# Patient Record
Sex: Female | Born: 1975 | Race: Black or African American | Hispanic: No | Marital: Single | Smoking: Never smoker
Health system: Southern US, Community
[De-identification: ages and names within clinical notes are randomized; demographics above are authoritative.]

---

## 2012-03-14 DIAGNOSIS — Z8781 Personal history of (healed) traumatic fracture: Secondary | ICD-10-CM | POA: Insufficient documentation

## 2012-04-19 DIAGNOSIS — M25473 Effusion, unspecified ankle: Secondary | ICD-10-CM | POA: Insufficient documentation

## 2012-04-19 DIAGNOSIS — M25673 Stiffness of unspecified ankle, not elsewhere classified: Secondary | ICD-10-CM | POA: Insufficient documentation

## 2012-05-31 DIAGNOSIS — M76829 Posterior tibial tendinitis, unspecified leg: Secondary | ICD-10-CM | POA: Insufficient documentation

## 2012-07-08 DIAGNOSIS — M7500 Adhesive capsulitis of unspecified shoulder: Secondary | ICD-10-CM | POA: Insufficient documentation

## 2013-03-26 ENCOUNTER — Emergency Department: Payer: Self-pay | Admitting: Emergency Medicine
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
## 2017-10-14 ENCOUNTER — Encounter: Payer: Self-pay | Admitting: Podiatry

## 2017-10-14 ENCOUNTER — Ambulatory Visit (INDEPENDENT_AMBULATORY_CARE_PROVIDER_SITE_OTHER): Payer: 59

## 2017-10-14 ENCOUNTER — Telehealth: Payer: Self-pay | Admitting: *Deleted

## 2017-10-14 ENCOUNTER — Ambulatory Visit (INDEPENDENT_AMBULATORY_CARE_PROVIDER_SITE_OTHER): Payer: 59 | Admitting: Podiatry

## 2017-10-14 VITALS — BP 112/76 | HR 75 | Resp 16

## 2017-10-14 DIAGNOSIS — M7751 Other enthesopathy of right foot: Secondary | ICD-10-CM

## 2017-10-14 DIAGNOSIS — M19079 Primary osteoarthritis, unspecified ankle and foot: Secondary | ICD-10-CM | POA: Diagnosis not present

## 2017-10-14 DIAGNOSIS — S82899A Other fracture of unspecified lower leg, initial encounter for closed fracture: Secondary | ICD-10-CM

## 2017-10-14 NOTE — Telephone Encounter (Signed)
Orders to J. Quintana, RN for pre-cert, and faxed to [facility]. 

## 2017-10-14 NOTE — Progress Notes (Signed)
  Subjective:  Patient ID: Kathleen Atkinson, female    DOB: 1976-08-20,  MRN: [id number] HPI Chief Complaint  Patient presents with  . Ankle Pain    Medial ankle right - injury and surgery in 2012, notice she is having more burning, tightness, can't rotate or bend ankle, over the years she has had PT, ice, injections, thinks she has developed scar tissue, affects walking(weak)    41 y.o. female presents with the above complaint.     No past medical history on file. No past surgical history on file. No current outpatient medications on file.  No Known Allergies Review of Systems  Musculoskeletal: Positive for gait problem.  All other systems reviewed and are negative.  Objective:   Vitals:   10/14/17 0903  BP: 112/76  Pulse: 75  Resp: 16    General: Well developed, nourished, in no acute distress, alert and oriented x3   Dermatological: Skin is warm, dry and supple bilateral. Nails x 10 are well maintained; remaining integument appears unremarkable at this time. There are no open sores, no preulcerative lesions, no rash or signs of infection present.  Vascular: Dorsalis Pedis artery and Posterior Tibial artery pedal pulses are 2/4 bilateral with immedate capillary fill time. Pedal hair growth present. No varicosities and no lower extremity edema present bilateral.   Neruologic: Grossly intact via light touch bilateral. Vibratory intact via tuning fork bilateral. Protective threshold with Semmes Wienstein monofilament intact to all pedal sites bilateral. Patellar and Achilles deep tendon reflexes 2+ bilateral. No Babinski or clonus noted bilateral.   Musculoskeletal: No gross boney pedal deformities bilateral. No pain, crepitus, or limitation noted with foot and ankle range of motion bilateral. Muscular strength 5/5 in all groups tested bilateral. She has pain on palpation to the medial malleolus and the skin is very sensitive area most likely allodynia or an allodynic type pain.  She has limited range of motion with dorsiflexion and plantarflexion with and without assistance. Flexed knee is no better dorsiflexion. She has some crepitation on range of motion. Screws and plate are very prominent.  Gait: Unassisted, Nonantalgic.    Radiographs:  3 views right ankle demonstrates plate and screws to the right ankle. Screws were removed from the medial malleolus. Joint space narrowing some subchondral sclerosis but no eburnation no cystic deformity.  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  Plan: At this point I highly recommended that she follow-up with Dr. Logan BoresEvans for possible ankle arthroscopy. We are requesting a CT scan secondary to the chronic pain and inability to move the ankle without pain.     Torrie Lafavor T. SunizonaHyatt, [place]
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
## 2017-10-26 ENCOUNTER — Telehealth: Payer: Self-pay | Admitting: *Deleted

## 2017-10-26 ENCOUNTER — Ambulatory Visit
Admission: RE | Admit: 2017-10-26 | Discharge: 2017-10-26 | Disposition: A | Discharge status: Home / Self Care | Payer: 59 | Source: Ambulatory Visit | Attending: Podiatry | Admitting: Podiatry

## 2017-10-26 NOTE — Telephone Encounter (Signed)
-----   Message from Elinor ParkinsonMax T Hyatt, [place] sent at 10/26/2017 11:56 AM EST ----- Have her in to see Dr. Logan BoresEvans.  He may feel that she needs an ankle arthroscopy.

## 2017-10-26 NOTE — Telephone Encounter (Signed)
Thanks

## 2017-10-26 NOTE — Telephone Encounter (Signed)
Left message informing pt Dr. Al CorpusHyatt had reviewed the CT results, and recommended pt see Dr. Logan BoresEvans to discuss treatment options.

## 2017-11-03 ENCOUNTER — Ambulatory Visit: Payer: 59 | Admitting: Podiatry

## 2017-11-05 ENCOUNTER — Telehealth: Payer: Self-pay | Admitting: *Deleted

## 2017-11-05 ENCOUNTER — Encounter: Payer: Self-pay | Admitting: Podiatry

## 2017-11-05 ENCOUNTER — Ambulatory Visit (INDEPENDENT_AMBULATORY_CARE_PROVIDER_SITE_OTHER): Payer: 59 | Admitting: Podiatry

## 2017-11-05 DIAGNOSIS — M659 Synovitis and tenosynovitis, unspecified: Secondary | ICD-10-CM | POA: Diagnosis not present

## 2017-11-05 DIAGNOSIS — M2041 Other hammer toe(s) (acquired), right foot: Secondary | ICD-10-CM

## 2017-11-05 NOTE — Telephone Encounter (Signed)
"  I'm calling to schedule my surgery with Dr. Logan BoresEvans."  What are you having?  "He's going to do surgery on my ankle."  I'm going to have to call you back.  I am unsure of what you are going to have.  I have to speak with Dr. Logan BoresEvans and discuss when he can possibly do your surgery.  "I am going back to work.  Leave me a message if I don't answer."
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
## 2017-11-10 NOTE — Telephone Encounter (Signed)
I am returning your call.  Do you still want to schedule surgery?  "Yes, I do."  He can do it on December 26 or December 27.  "Let's do it on December 26, that date will be perfect."  I will get it scheduled.  You will hear from someone from the surgical center the Friday or Monday before the surgery date with the arrival time.  You can register with the surgical center, instructions are in the brochure.

## 2017-11-16 DIAGNOSIS — M722 Plantar fascial fibromatosis: Secondary | ICD-10-CM

## 2017-11-19 ENCOUNTER — Telehealth: Payer: Self-pay | Admitting: *Deleted

## 2017-11-19 NOTE — Telephone Encounter (Addendum)
I received a call from the surgical center that you were supposed to have a short stay after your surgery at the facility because you didn't have anyone to stay with you the night of surgery. We're going to have to reschedule your surgery.  They can do it on January 14.  "I have already spoke to them at the surgical center and they said they would make arrangements for me to do it at the hospital.  "That is not possible.  They cannot schedule your surgery at the hospital.  They are not affiliated with [facility].  I would have to schedule that with [facility].  [facility] requires that a history and physical form be filled out by your primary care physician, have you seen your primary care physician within the past 30 days.  "What do you mean a physical?  Are you talking about an annual physical?"  Yes, a complete physical.  "This doesn't make any since.  Where did the ball drop?"  Did you inform Dr. Logan BoresEvans that you would need a short stay.  "I put it in the portal."  We do not have access to the surgical center's portal.  "So the doctor doesn't review my history that I put in the portal?"  No, he doesn't know unless you tell him during your visit with him and you disclose that information.  He then would let me know that a short stay is needed and I would inform the surgical center when I scheduled the surgery.  "Here I am thinking everything is good.  This is a huge problem.  So you're telling me once you get the physical form filled out you can schedule my surgery?  Can you email me the forms?  Send it to [email or web address]."   Yes, I can email them to you.  "Could you fax those forms to my doctor's office instead?  His name is Dr. Sunday CornLohti and the fax number is [phone]!"  I will send him the forms.   Better yet, are you all affiliated with the state?"  Not that I am aware but we are affiliated with [facility].  Would you like to speak with my office manager?  "No, this has gone beyond your office manager!    I called and informed Aram BeechamCynthia.  The surgery was canceled.

## 2017-11-24 ENCOUNTER — Telehealth: Payer: Self-pay | Admitting: *Deleted

## 2017-11-24 NOTE — Telephone Encounter (Signed)
[facility] states Dr. Carney CornersLahti states she will not sign a medical clearance for this pt, but she will send copy of 11/10/2017 office note.

## 2017-11-24 NOTE — Telephone Encounter (Signed)
Left message informing pt I had spoken with Dr. Logan BoresEvans and he said the medical clearance letter must come from the primary care physician.

## 2017-11-24 NOTE — Telephone Encounter (Signed)
Pt called to see if medical clearance had been received from [facility].

## 2017-11-24 NOTE — Telephone Encounter (Addendum)
I called pt to inform her the physical exam had not been received from [facility], I had received a call from [facility] stating Dr. Leana RoeStacy Lahti would not complete the physical exam, because they are a gynecological office. Pt states she had spoken to [facility], who told her physical exam could be gotten from [facility], and she did not understand how no one in our office understood what was going on with her surgery. I tried to tell pt Delydia, may not have known at the time Dr. Selinda OrionLathi as a gynecologist could not complete the exam, pt interrupted and asked for my name and who I was multiple times and I gave my name and title. I continued to speak, but pt hung up.

## 2017-11-24 NOTE — Telephone Encounter (Signed)
[facility] states they are a Gynecology office and can not give medical clearance, pt needs to contact PCP.

## 2017-11-25 NOTE — Telephone Encounter (Signed)
Pt called states the physical exam can be used from Dr. Carney CornersLahti and is not concerned she is an ob-gyn, or we can refer pt to another doctor at another facility or see can reimburse her for having another physical exam or she will go to court for reimbursement, and to be mindful that she is already financially stressed due to not getting proper information from our office and she expects an answer tomorrow morning because it

## 2017-11-26 ENCOUNTER — Telehealth: Payer: Self-pay | Admitting: Podiatry

## 2017-11-26 NOTE — Telephone Encounter (Addendum)
I informed Dr. Logan BoresEvans of the current situation with pt, he stated he will speak with Domingo SepB. Bonkemeyer, office manager. I informed B. Bonkemeyer, Dr. Logan BoresEvans had requested to speak with her, and she stated she would contact pt, contact Dr. Logan BoresEvans with more information. I spoke with B. Bonkemeyer and she states she has placed several calls to pt without pt response or call back.

## 2017-12-03 ENCOUNTER — Encounter: Payer: 59 | Admitting: Podiatry

## 2017-12-06 ENCOUNTER — Telehealth: Payer: Self-pay | Admitting: *Deleted

## 2017-12-08 NOTE — Telephone Encounter (Signed)
I attempted to call the patient.  I left her a message to call me tomorrow.

## 2017-12-08 NOTE — Telephone Encounter (Signed)
"  Ms. Kathleen Atkinson called here and asked me to schedule her surgery at [facility].  I told her I do not do the scheduling for your office.  I told her that she would need to call you.  She was not very pleasant.  I told her I would pass the message on to you.  Please give her a call."  I'll give her a call.

## 2017-12-08 NOTE — Telephone Encounter (Signed)
I attempted to call the patient.  I left her a message to call me on tomorrow.

## 2017-12-09 NOTE — Telephone Encounter (Signed)
"  I'm returning your call.  I will be in an appointment today and will not have access to my phone.  If you want to give me a call back and leave me a detailed message, I will attempt to get back to you at my convenience."

## 2017-12-10 ENCOUNTER — Encounter: Payer: 59 | Admitting: Podiatry

## 2017-12-10 NOTE — Telephone Encounter (Signed)
I attempted to call patient again.  I left her a message to call me back. 

## 2017-12-14 NOTE — Telephone Encounter (Signed)
"  I am returning your call once more.  Please leave a detailed message as to the reason of your previous two calls so I can review it and get back to you if I need to.  In the meantime if you would email me the physical form so I can review that and have that filled out.  I believe you have my email address on file.  It is [email or web address].  Thank you.

## 2017-12-15 NOTE — Telephone Encounter (Signed)
I left patient a message asking her to call me back.  I informed her we are not able to schedule a short stay or overnight.

## 2017-12-17 NOTE — Telephone Encounter (Signed)
"  I'm returning your call.  You have been calling me about my surgery."  I was calling to let you know that Dr. Logan BoresEvans said there is not justification for you to have an overnight stay (23 hr short stay).  "What does he mean?  I have severe nausea and vomiting from having surgery.  Again if he had access to the portal at the surgical center where we were going to have the surgery in the first place, he would have know this.  He doesn't know anything about my history at all."  He has information in our Epic System with [facility].  If there's was anything of concern at the surgical center, they would inform Dr. Logan BoresEvans.  "I am not a part of [facility], I have never been there."  We have access to other physicians records that are on the same Epic system that we are on.  For instance [facility] we have access to that information.  "So he should see in my notes that I have a difficult recovery after surgery.  It is there in my notes."  I don't think that nausea and vomiting is a justification for a short stay.  "I have done it before as you can see in my notes."  Another thing, Dr. Logan BoresEvans is not a medical doctor.  He is a Risk analystodiatrist.   He cannot admit patients into the hospital.  "I've seen Podiatrist before and they have admitted me after surgery before as you can see in my chart.  You're trying to back pedal.  I don't know why you all are giving me such a hard time with getting this surgery done.  I could have had this surgery done and over with by now.  Is this the information you have been calling me about for two weeks but haven't left a message?"  Yes, I was calling to give you this information.  "You all need to compensate me for the time I have lost.  Whose the board of director over Dr. Logan BoresEvans?  I'd like to contact him."  I am not sure who you would need to speak with.  I can transfer you to my office manager, Fernande BrasBonnie Bonkemeyer.  Hold just a moment.  I am sorry, Kendal HymenBonnie has left for the day.  Our office closes earlier  on Fridays.  I can send you to her voicemail.  "Just leave her a message of my concerns and have her call me on Monday."

## 2017-12-21 NOTE — Telephone Encounter (Signed)
Attempted to call patient regarding her concerns.  I called every number available and received the message "the person you are calling is not taking calls at this time"  Never gave me the opportunity to leave a message at all.

## 2017-12-22 NOTE — Telephone Encounter (Signed)
"  I'm calling to see if you have received my history and physical form from your [place] office."  The only history and physical form I have is from your OB/Gyn.  "No, I went to my family doctor and they sent my forms to the [place] office."  When did they send it?  "They sent it last week, I think Wednesday of last week."  I will call the [place] office to see if they have it.  "Can you email me and let me know if they have it?  I'm having problems with my phone service, my calls are going in and out."  Okay, I will email you.  Fernande BrasBonnie Bonkemeyer, the office manager, has been trying to reach you.  Would you like me to transfer you to her.  "No, I'm using someone else's phone.  Can you just have her email me?"  I will let her know.

## 2017-12-22 NOTE — Telephone Encounter (Signed)
"  I was calling to confirm you have received my physical form.  It was returned back to the [place] office.  If you can see if you have that so we can get a date scheduled.  You can give me a call back at [phone].  I have been having some issues with my service.  If you are able to email me to confirm that you received the physical form.  Again this is Kathleen Atkinson.  Thank you."

## 2017-12-23 NOTE — Telephone Encounter (Signed)
"  I'm calling to see if the [place] office received the information from my doctor."  No, they did not receive a fax from your doctor.  "I asked that this information be emailed to me yesterday.  What email do you have down for me?"  I have [email or web address].  I also needed to speak to you.  You will need to schedule a consultation with Dr. Logan BoresEvans prior to us getting your surgery scheduled.  "Why do I need to do that when I have already had a consult with him?"  You have several questions so he needs to see you in order to answer any questions or concerns that you may have.  "I have only asked questions that you all should have informed me about during my consultation.  Now this is going to delay me being out of work even longer.  I'm going to need another note because this is pushing my surgery out even further.  Who is his nurse?"  His nurse name is Elmarie Shileyiffany but she's actually a certified Engineer, sitemedical assistant and also Marylene Landngela.  However, if you are needing a note, you need to speak with Algis GreenhouseJanet Albert.  She takes care of FMLA.  "Well could you please send me an email stating that Dr. Logan BoresEvans wants me to have another consultation?"  Yes, I can send you an email.  "Could you do that today?"  Yes, I can do that today.  "What's the fax number to your [place] office?"  It is [phone].  I sent her an email as requested.

## 2017-12-29 ENCOUNTER — Telehealth: Payer: Self-pay | Admitting: Podiatry

## 2017-12-29 NOTE — Telephone Encounter (Signed)
This is Kathleen Atkinson. My birth date is 25-Nov-1976 and I'm calling to confirm that the medical paperwork I faxed Thursday 24 January was received and has been filled out by Dr. Logan BoresEvans. You can reach me back at [phone]. Thank you.

## 2017-12-29 NOTE — Telephone Encounter (Signed)
Message given to B. Bonkemeyer.

## 2018-02-16 ENCOUNTER — Telehealth: Payer: Self-pay | Admitting: Podiatry

## 2018-02-16 NOTE — Telephone Encounter (Signed)
This is Dream Steffy. My number is [phone]. I e-mailed a request for my medical records on 12 March. I need to confirm that you guys have received that and those records have been sent or they are being sent. Please contact me to confirm. Thank you.

## 2018-03-08 ENCOUNTER — Telehealth: Payer: Self-pay | Admitting: Podiatry

## 2018-03-08 NOTE — Telephone Encounter (Signed)
This is Kathleen Atkinson. Date of birth is 1976-07-17. Phone number is [phone]. I am returning a call that I received April 3 rd in regards to a request for medical records. I sent an e-mail to your support on February 26 requesting medical records and that was followed up with a message left on March the 11 th for medical records. I find it hard believe that its taken more than a month for me to receive a call back. Please gather all of my records, including x-rays, notes from staff, notes from the surgical center and anything else and overnight that information to my P.O. Box that you have on file please. Thank you

## 2018-03-09 ENCOUNTER — Encounter: Payer: Self-pay | Admitting: Podiatry

## 2018-03-09 ENCOUNTER — Telehealth: Payer: Self-pay | Admitting: Podiatry

## 2018-03-09 NOTE — Progress Notes (Signed)
Pt's requested medical records were placed up front in a manila envelope to be mailed out to her tomorrow to her P.O. Box per her request via a signed medical records release form.

## 2018-03-09 NOTE — Telephone Encounter (Signed)
Andreas BlowerHey Jessica, it's Aram Beechamynthia. I was just going through my other way and I found that she was scheduled for surgery on 26 December. I'm trying to see if I can find any other information but she was scheduled for 26 December.

## 2018-03-09 NOTE — Telephone Encounter (Signed)
Called and spoke with Aram [facility]ynthia at [facility] to see if they had any information ton Ms. Dekker. Aram BeechamCynthia stated she did not have anything at all on that pt in their system.

## 2018-03-17 ENCOUNTER — Encounter: Payer: Self-pay | Admitting: Podiatry

## 2018-03-17 NOTE — Progress Notes (Signed)
Pt's medical paperwork that she requested has been placed in a manila envelope and placed up front to be mailed out either today, Thursday 18 April or on Monday, 22 April as the office is closed tomorrow, Friday 19 April for Easter Holiday.

## 2019-09-18 ENCOUNTER — Encounter: Payer: Self-pay | Admitting: Emergency Medicine

## 2019-09-18 ENCOUNTER — Emergency Department (HOSPITAL_COMMUNITY)
Admission: EM | Admit: 2019-09-18 | Discharge: 2019-09-18 | Disposition: A | Discharge status: Home / Self Care | Payer: Medicaid - Out of State | Attending: Emergency Medicine | Admitting: Emergency Medicine

## 2019-09-18 ENCOUNTER — Emergency Department (HOSPITAL_COMMUNITY): Payer: Medicaid - Out of State

## 2019-09-18 DIAGNOSIS — S7002XA Contusion of left hip, initial encounter: Secondary | ICD-10-CM | POA: Insufficient documentation

## 2019-09-18 DIAGNOSIS — W010XXA Fall on same level from slipping, tripping and stumbling without subsequent striking against object, initial encounter: Secondary | ICD-10-CM | POA: Diagnosis not present

## 2019-09-18 DIAGNOSIS — W19XXXA Unspecified fall, initial encounter: Secondary | ICD-10-CM

## 2019-09-18 DIAGNOSIS — Y939 Activity, unspecified: Secondary | ICD-10-CM | POA: Insufficient documentation

## 2019-09-18 DIAGNOSIS — Y990 Civilian activity done for income or pay: Secondary | ICD-10-CM | POA: Insufficient documentation

## 2019-09-18 DIAGNOSIS — Y929 Unspecified place or not applicable: Secondary | ICD-10-CM | POA: Insufficient documentation

## 2019-09-18 DIAGNOSIS — S63502A Unspecified sprain of left wrist, initial encounter: Secondary | ICD-10-CM | POA: Insufficient documentation

## 2019-09-18 DIAGNOSIS — S6992XA Unspecified injury of left wrist, hand and finger(s), initial encounter: Secondary | ICD-10-CM | POA: Diagnosis present

## 2019-09-18 MED ORDER — OXYCODONE-ACETAMINOPHEN 5-325 MG PO TABS
1.0000 | ORAL_TABLET | Freq: Once | ORAL | Status: AC
  Administered 2019-09-18: 10:00:00 1 via ORAL
  Filled 2019-09-18: qty 1

## 2019-09-18 NOTE — ED Notes (Signed)
Patient placed into gown. Jewelry and clothes placed in bag at bedside.

## 2019-09-18 NOTE — ED Notes (Signed)
Patient transported to X-ray 

## 2019-09-18 NOTE — ED Notes (Signed)
An After Visit Summary was printed and given to the patient. Discharge instructions given and no further questions at this time. Pt has splint and immobilizer on. RN assisted pt to car and pt stated she feels better is fine to drive.

## 2019-09-18 NOTE — ED Triage Notes (Signed)
Patient arrived via [facility] from work.  Patient slipped and fell from coffee on the floor.   C/O left wrist pain and  Posterior left hip pain.  Sam splint on left wrist per ems.  Radial pulses strong per ems.   Denies LOC and denies hitting head.   VS per ems.    A/Ox4 Ambulatory with assistance.   126/84 Hr-89 99% RA 98.1 oral temp.   No history of anxiety. Per ems patient has some anxiety.

## 2019-09-18 NOTE — ED Notes (Signed)
Ortho tech bedside 

## 2019-09-18 NOTE — ED Notes (Signed)
Butler, MD at bedside.  

## 2019-09-18 NOTE — ED Provider Notes (Signed)
[facility]-EMERGENCY DEPT Provider Note   CSN: [id number] Arrival date & time: 09/18/19  0930      History   Chief Complaint Chief Complaint  Patient presents with  . Fall  . Wrist Pain    left  . Hip Pain    left    HPI Kathleen Atkinson is a 43 y.o. female.  She is right-hand dominant and no significant past medical history.  She slipped on a wet floor at work and injured her left wrist and left hip.  She denies any loss of consciousness.  She is arriving by ambulance directly from the injury.  No numbness or weakness.  Denies any injury to head neck or back.  No chest pain or abdominal pain.  She has been able to ambulate since the fall. Rates the pain is 8 out of 10.  Throbbing in nature.    The history is provided by the patient.  Fall This is a new problem. The current episode started less than 1 hour ago. The problem has not changed since onset.Pertinent negatives include no chest pain, no abdominal pain, no headaches and no shortness of breath. The symptoms are aggravated by bending and twisting. Nothing relieves the symptoms. She has tried nothing for the symptoms. The treatment provided no relief.    No past medical history on file.  Patient Active Problem List   Diagnosis Date Noted  . 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 Posterior tibial tendinitis 05/31/2012  . Ankle swelling 04/19/2012  . Limitation of joint motion of ankle 04/19/2012  . History of ankle fracture 03/14/2012    No past surgical history on file.   OB History   No obstetric history on file.      Home Medications    Prior to Admission medications   Not on File    Family History No family history on file.  Social History Social History   Tobacco Use  . Smoking status: Never Smoker  . Smokeless tobacco: Never Used  Substance Use Topics  . Alcohol use: No    Frequency: Never  . Drug use: Not on file     Allergies   Patient has no known  allergies.   Review of Systems Review of Systems  Constitutional: Negative for fever.  HENT: Negative for sore throat.   Eyes: Negative for visual disturbance.  Respiratory: Negative for shortness of breath.   Cardiovascular: Negative for chest pain.  Gastrointestinal: Negative for abdominal pain.  Genitourinary: Negative for dysuria.  Musculoskeletal: Negative for back pain.  Skin: Negative for rash.  Neurological: Negative for headaches.     Physical Exam Updated Vital Signs BP 120/79 (BP Location: Right Arm)   Pulse 71   Temp 98.1 F (36.7 C) (Oral)   Resp 18   LMP 09/04/2019   SpO2 99%   Physical Exam Vitals signs and nursing note reviewed.  Constitutional:      General: She is not in acute distress.    Appearance: She is well-developed.  HENT:     Head: Normocephalic and atraumatic.  Eyes:     Conjunctiva/sclera: Conjunctivae normal.  Neck:     Musculoskeletal: Neck supple.  Cardiovascular:     Rate and Rhythm: Normal rate and regular rhythm.     Heart sounds: No murmur.  Pulmonary:     Effort: Pulmonary effort is normal. No respiratory distress.     Breath sounds: Normal breath sounds.  Abdominal:     Palpations: Abdomen  is soft.     Tenderness: There is no abdominal tenderness.  Musculoskeletal:        General: Tenderness and signs of injury present.     Right lower leg: No edema.     Left lower leg: No edema.     Comments: Left upper extremity tender and swollen at the wrist and hand snuffbox.  No open wounds.  Left lower extremity tenderness over ischial tuberosity.  Pain with attempting range of motion.  No shortening.  Knee and ankle nontender.  Right lower extremity no tenderness or pain full range of motion without limitations.  Right upper extremity same.  Radial pulses 2+.  Sensation and motor distal intact.  Skin:    General: Skin is warm and dry.     Capillary Refill: Capillary refill takes less than 2 seconds.  Neurological:     General: No  focal deficit present.     Mental Status: She is alert.     Sensory: No sensory deficit.     Motor: No weakness.      ED Treatments / Results  Labs (all labs ordered are listed, but only abnormal results are displayed) Labs Reviewed - No data to display  EKG None  Radiology Dg Wrist Complete Left  Result Date: 09/18/2019 CLINICAL DATA:  Pain. EXAM: LEFT WRIST - COMPLETE 3+ VIEW COMPARISON:  None. FINDINGS: There is no evidence of fracture or dislocation. There is no evidence of arthropathy or other focal bone abnormality. Soft tissues are unremarkable. IMPRESSION: Negative. Electronically Signed   By: Dorise Bullion III M.D   On: 09/18/2019 10:25   Dg Hand Complete Left  Result Date: 09/18/2019 CLINICAL DATA:  Pain after fall EXAM: LEFT HAND - COMPLETE 3+ VIEW COMPARISON:  None. FINDINGS: There is no evidence of fracture or dislocation. There is no evidence of arthropathy or other focal bone abnormality. Soft tissues are unremarkable. IMPRESSION: Negative. Electronically Signed   By: Dorise Bullion III M.D   On: 09/18/2019 10:23   Dg Hip Unilat With Pelvis 2-3 Views Left  Result Date: 09/18/2019 CLINICAL DATA:  Pain. EXAM: DG HIP (WITH OR WITHOUT PELVIS) 2-3V LEFT COMPARISON:  None. FINDINGS: There is no evidence of hip fracture or dislocation. There is no evidence of arthropathy or other focal bone abnormality. IMPRESSION: Negative. Electronically Signed   By: Dorise Bullion III M.D   On: 09/18/2019 10:26    Procedures Procedures (including critical care time)  Medications Ordered in ED Medications  oxyCODONE-acetaminophen (PERCOCET/ROXICET) 5-325 MG per tablet 1 tablet (has no administration in time range)     Initial Impression / Assessment and Plan / ED Course  I have reviewed the triage vital signs and the nursing notes.  Pertinent labs & imaging results that were available during my care of the patient were reviewed by me and considered in my medical decision  making (see chart for details).  Clinical Course as of Sep 17 1912  Mon Sep 18, 2643  4657 43 year old female here after slip and fall with injuries to left wrist and left hip.  Differential includes fracture, dislocation, sprain, contusion   [MB]  1123 Patient's x-rays do not show any acute fracture.  On reexamination she still tender to her snuffbox so we will put in a thumb spica and have follow-up with hand.   [MB]    Clinical Course User Index [MB] Hayden Rasmussen, MD        Final Clinical Impressions(s) / ED Diagnoses   Final  diagnoses:  Fall, initial encounter  Sprain of left wrist, initial encounter  Contusion of left hip, initial encounter    ED Discharge Orders    None       Terrilee FilesButler, Shalisha Clausing C, MD 09/18/19 [phone]

## 2019-09-18 NOTE — Discharge Instructions (Signed)
You were seen in the emergency department for evaluation of injuries from a fall.  You had x-rays of your left wrist hand and left hip that did not show any obvious fractures.  You were placed in a wrist splint to help protect that area. If you continue to have pain in the wrist after resting it, please continue with the splint and followup with orthopedics. Return if any concerns.

## 2020-11-26 IMAGING — CR DG HIP (WITH OR WITHOUT PELVIS) 2-3V*L*
3 series · 3 of 3 positions shown · non-contrast
Comparison: None.

CLINICAL DATA: Pain.

EXAM:
DG HIP (WITH OR WITHOUT PELVIS) 2-3V LEFT

[t pelvis ap]
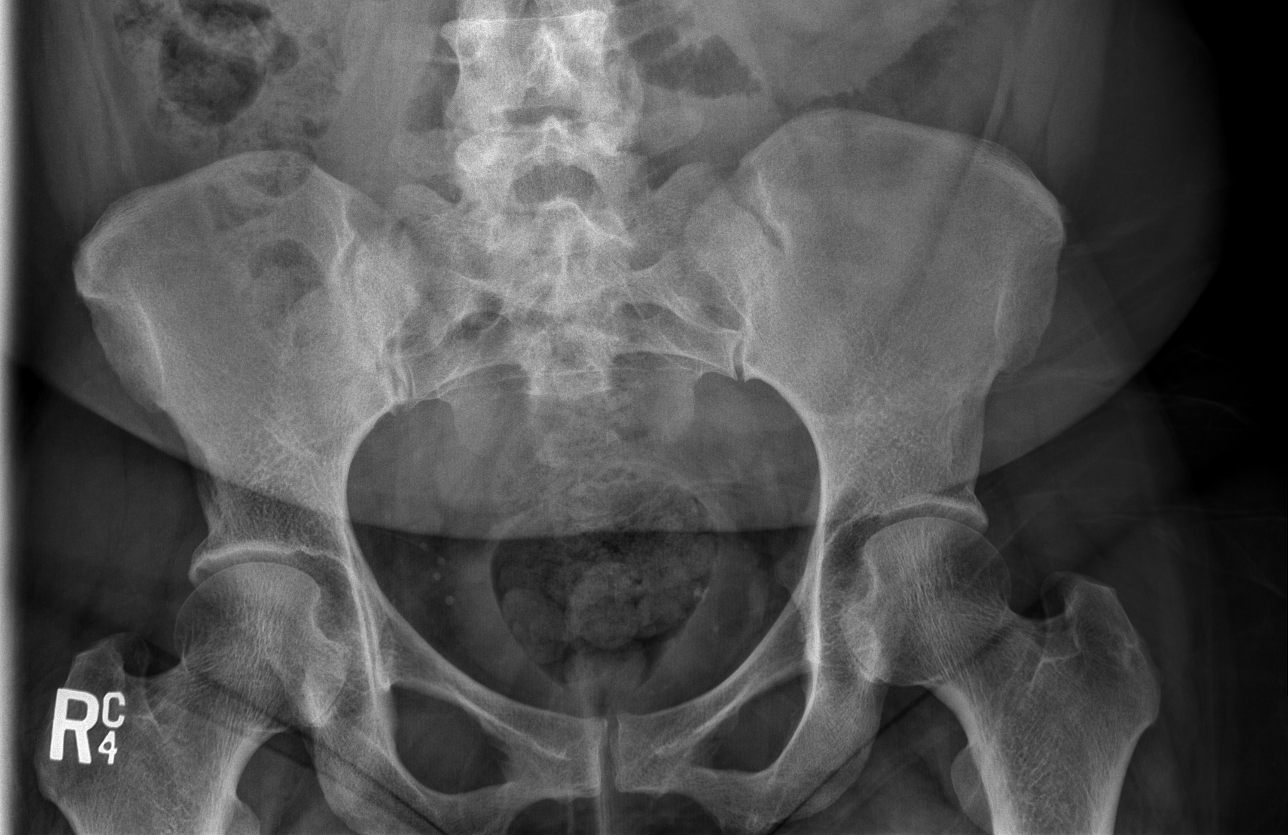

[t hip ap left]
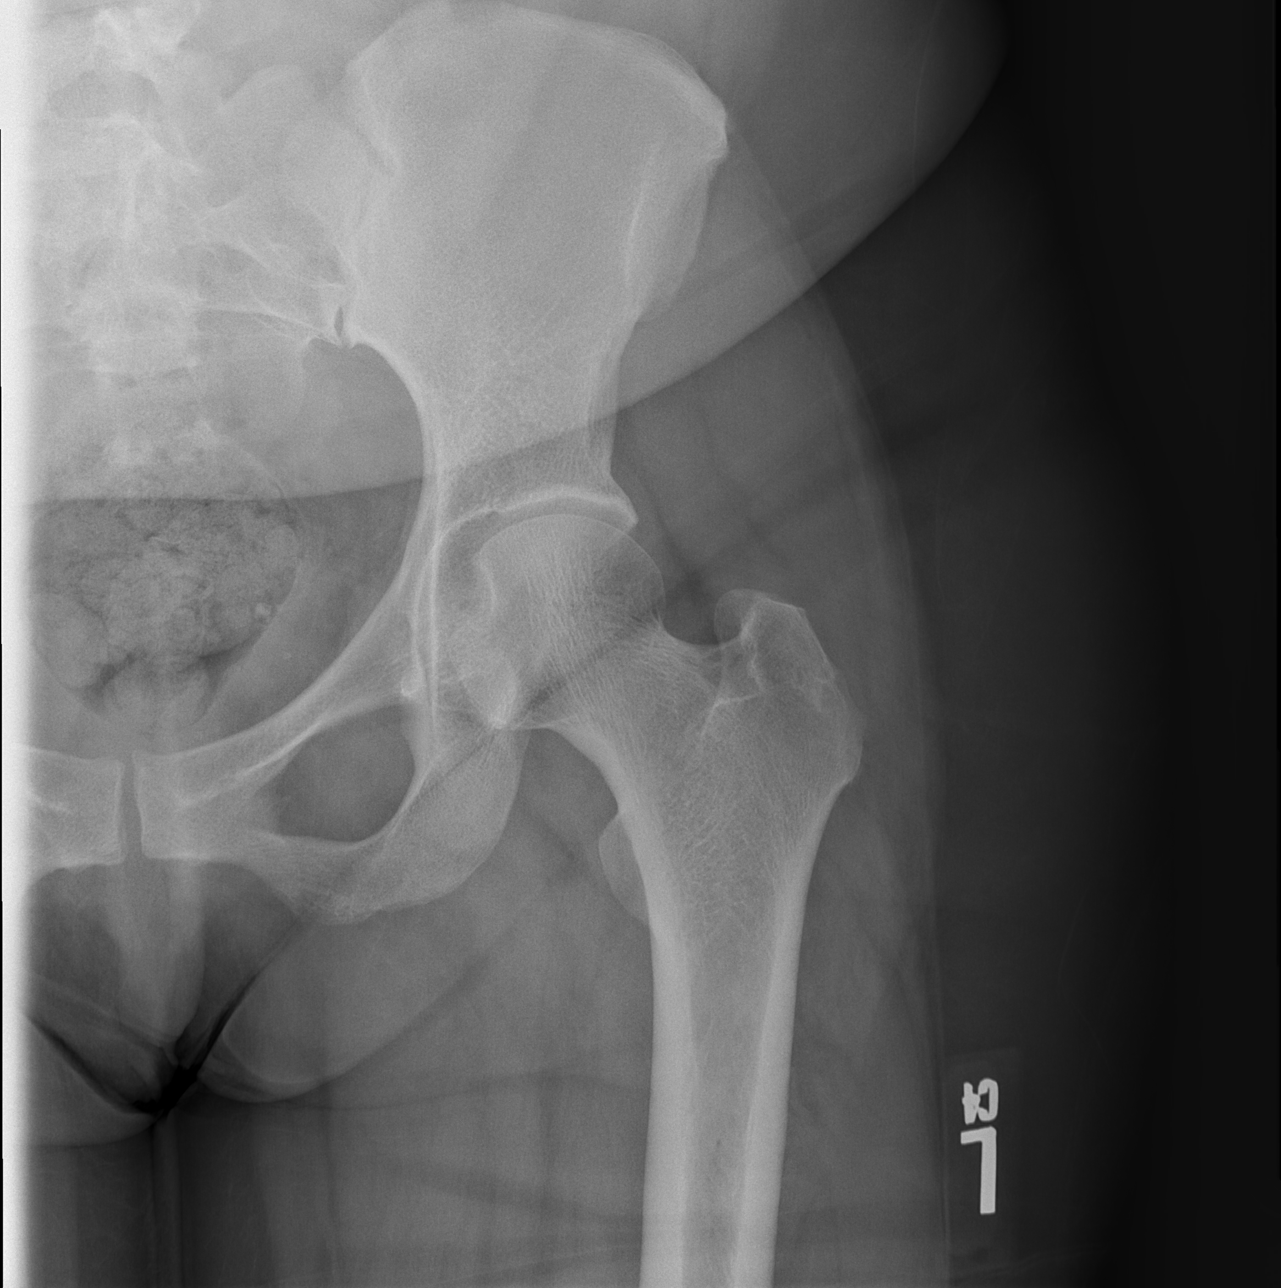

[t hip frog leg left]
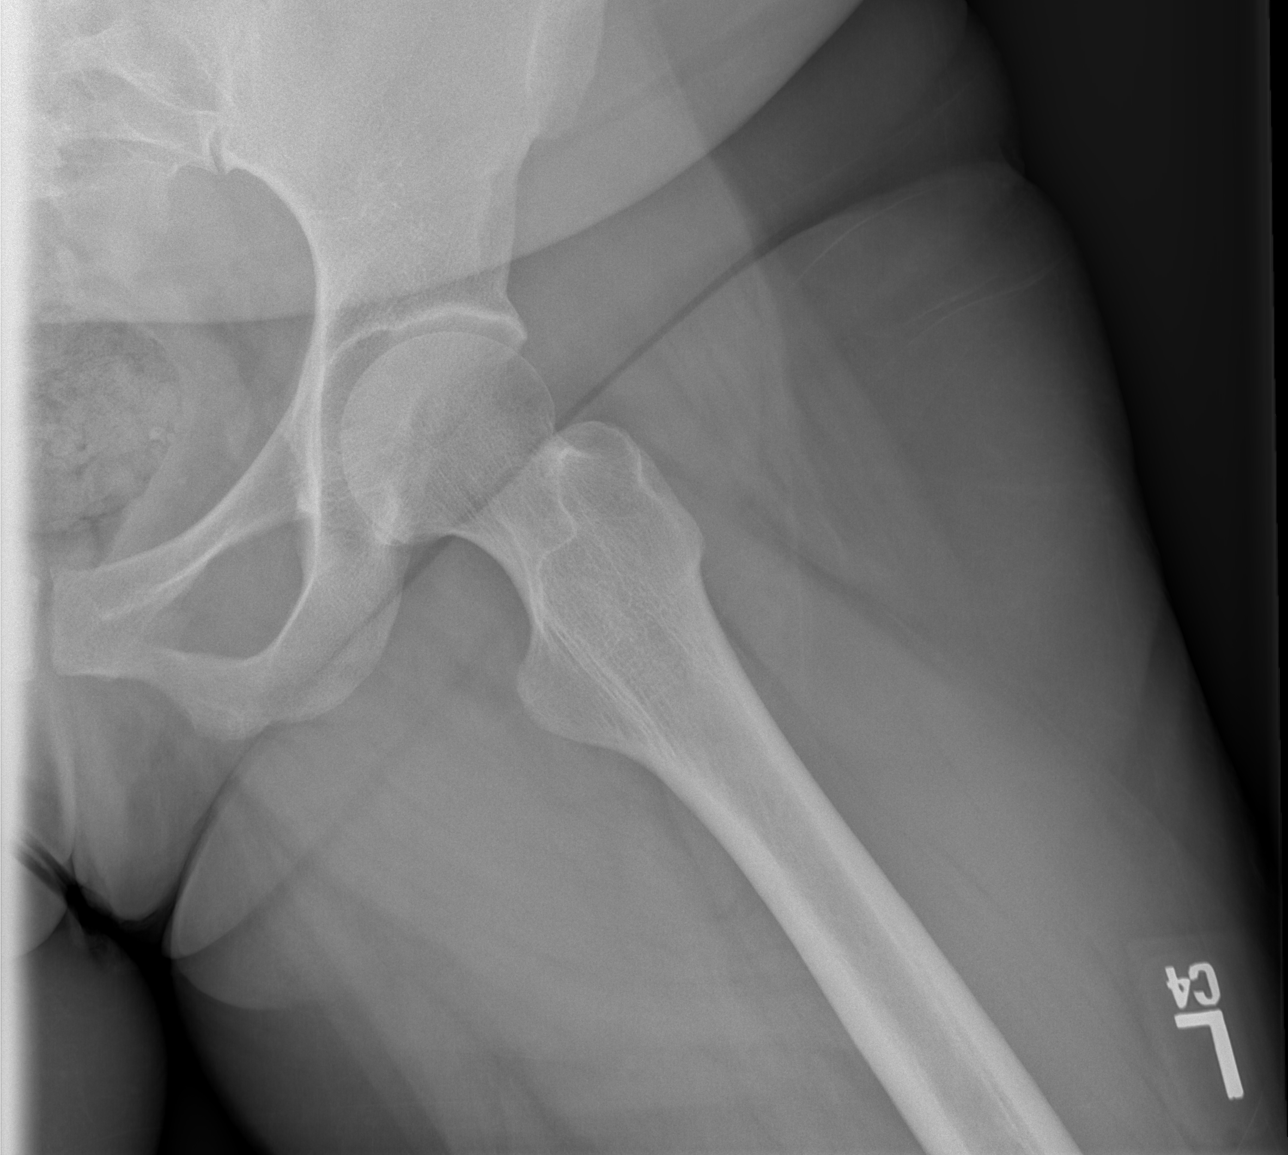

[3 of 3 positions shown; findings below may reference images not displayed]

FINDINGS: There is no evidence of hip fracture or dislocation. There is no
evidence of arthropathy or other focal bone abnormality.
IMPRESSION: Negative.

## 2022-01-29 ENCOUNTER — Emergency Department
Admission: EM | Admit: 2022-01-29 | Discharge: 2022-01-29 | Disposition: A | Discharge status: Home / Self Care | Payer: Medicaid - Out of State | Attending: Emergency Medicine | Admitting: Emergency Medicine

## 2022-01-29 ENCOUNTER — Encounter: Payer: Self-pay | Admitting: Emergency Medicine

## 2022-01-29 ENCOUNTER — Other Ambulatory Visit: Payer: Self-pay

## 2022-01-29 ENCOUNTER — Emergency Department: Payer: Medicaid - Out of State

## 2022-01-29 DIAGNOSIS — Y92410 Unspecified street and highway as the place of occurrence of the external cause: Secondary | ICD-10-CM | POA: Insufficient documentation

## 2022-01-29 DIAGNOSIS — M542 Cervicalgia: Secondary | ICD-10-CM | POA: Diagnosis not present

## 2022-01-29 DIAGNOSIS — R519 Headache, unspecified: Secondary | ICD-10-CM | POA: Insufficient documentation

## 2022-01-29 MED ORDER — CYCLOBENZAPRINE HCL 10 MG PO TABS: 10.0000 mg | ORAL_TABLET | Freq: Three times a day (TID) | ORAL | 0 refills | Status: AC | PRN

## 2022-01-29 MED ORDER — ACETAMINOPHEN 500 MG PO TABS
1000.0000 mg | ORAL_TABLET | Freq: Once | ORAL | Status: AC
  Administered 2022-01-29: 1000 mg via ORAL
  Filled 2022-01-29: qty 2
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
## 2022-01-29 NOTE — Discharge Instructions (Addendum)
-  Take Tylenol/ibuprofen as needed for pain. ?-Follow-up with your primary care provider as needed ?-Return to the emergency department anytime if you begin to experience any new or worsening symptoms ?
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
